# Patient Record
Sex: Female | Born: 1965 | Race: White | Hispanic: Yes | Marital: Married | Smoking: Never smoker
Health system: Southern US, Community
[De-identification: ages and names within clinical notes are randomized; demographics above are authoritative.]

## PROBLEM LIST (undated history)

## (undated) HISTORY — PX: THYROIDECTOMY: SHX17

---

## 2012-08-24 ENCOUNTER — Emergency Department (HOSPITAL_BASED_OUTPATIENT_CLINIC_OR_DEPARTMENT_OTHER): Payer: BC Managed Care – PPO

## 2012-08-24 ENCOUNTER — Encounter (HOSPITAL_BASED_OUTPATIENT_CLINIC_OR_DEPARTMENT_OTHER): Payer: Self-pay | Admitting: *Deleted

## 2012-08-24 ENCOUNTER — Emergency Department (HOSPITAL_BASED_OUTPATIENT_CLINIC_OR_DEPARTMENT_OTHER)
Admission: EM | Admit: 2012-08-24 | Discharge: 2012-08-24 | Disposition: A | Discharge status: Home / Self Care | Payer: BC Managed Care – PPO | Attending: Emergency Medicine | Admitting: Emergency Medicine

## 2012-08-24 DIAGNOSIS — M549 Dorsalgia, unspecified: Secondary | ICD-10-CM | POA: Insufficient documentation

## 2012-08-24 DIAGNOSIS — IMO0001 Reserved for inherently not codable concepts without codable children: Secondary | ICD-10-CM | POA: Insufficient documentation

## 2012-08-24 DIAGNOSIS — J3489 Other specified disorders of nose and nasal sinuses: Secondary | ICD-10-CM | POA: Insufficient documentation

## 2012-08-24 DIAGNOSIS — Z3202 Encounter for pregnancy test, result negative: Secondary | ICD-10-CM | POA: Insufficient documentation

## 2012-08-24 DIAGNOSIS — R110 Nausea: Secondary | ICD-10-CM | POA: Insufficient documentation

## 2012-08-24 DIAGNOSIS — R059 Cough, unspecified: Secondary | ICD-10-CM | POA: Insufficient documentation

## 2012-08-24 DIAGNOSIS — R05 Cough: Secondary | ICD-10-CM | POA: Insufficient documentation

## 2012-08-24 DIAGNOSIS — J111 Influenza due to unidentified influenza virus with other respiratory manifestations: Secondary | ICD-10-CM | POA: Insufficient documentation

## 2012-08-24 DIAGNOSIS — R51 Headache: Secondary | ICD-10-CM | POA: Insufficient documentation

## 2012-08-24 DIAGNOSIS — H538 Other visual disturbances: Secondary | ICD-10-CM | POA: Insufficient documentation

## 2012-08-24 DIAGNOSIS — J029 Acute pharyngitis, unspecified: Secondary | ICD-10-CM | POA: Insufficient documentation

## 2012-08-24 LAB — URINALYSIS, ROUTINE W REFLEX MICROSCOPIC
Glucose, UA: NEGATIVE mg/dL
Leukocytes, UA: NEGATIVE
Protein, ur: NEGATIVE mg/dL
Specific Gravity, Urine: 1.003 — ABNORMAL LOW (ref 1.005–1.030)
Urobilinogen, UA: 0.2 mg/dL (ref 0.0–1.0)

## 2012-08-24 LAB — CBC WITH DIFFERENTIAL/PLATELET
Basophils Absolute: 0 10*3/uL (ref 0.0–0.1)
Basophils Relative: 0 % (ref 0–1)
Lymphocytes Relative: 10 % — ABNORMAL LOW (ref 12–46)
MCHC: 35.4 g/dL (ref 30.0–36.0)
Neutro Abs: 7.7 10*3/uL (ref 1.7–7.7)
Neutrophils Relative %: 83 % — ABNORMAL HIGH (ref 43–77)
RDW: 12.1 % (ref 11.5–15.5)
WBC: 9.3 10*3/uL (ref 4.0–10.5)

## 2012-08-24 LAB — BASIC METABOLIC PANEL
CO2: 29 mEq/L (ref 19–32)
Chloride: 99 mEq/L (ref 96–112)
Creatinine, Ser: 0.5 mg/dL (ref 0.50–1.10)
GFR calc Af Amer: >90 mL/min (ref >90)
Potassium: 3.6 mEq/L (ref 3.5–5.1)
Sodium: 140 mEq/L (ref 135–145)

## 2012-08-24 LAB — URINE MICROSCOPIC-ADD ON

## 2012-08-24 MED ORDER — HYDROMORPHONE HCL PF 1 MG/ML IJ SOLN
1.0000 mg | Freq: Once | INTRAMUSCULAR | Status: AC
  Administered 2012-08-24: 1 mg via INTRAVENOUS
  Filled 2012-08-24: qty 1

## 2012-08-24 MED ORDER — SODIUM CHLORIDE 0.9 % IV BOLUS (SEPSIS): 1000.0000 mL | Freq: Once | INTRAVENOUS | Status: DC

## 2012-08-24 MED ORDER — SODIUM CHLORIDE 0.9 % IV SOLN
INTRAVENOUS | Status: DC
  Administered 2012-08-24: 19:00:00 via INTRAVENOUS

## 2012-08-24 MED ORDER — PROMETHAZINE HCL 25 MG PO TABS: 25.0000 mg | ORAL_TABLET | Freq: Four times a day (QID) | ORAL | Status: DC | PRN

## 2012-08-24 MED ORDER — OSELTAMIVIR PHOSPHATE 75 MG PO CAPS: 75.0000 mg | ORAL_CAPSULE | Freq: Two times a day (BID) | ORAL | Status: DC

## 2012-08-24 MED ORDER — ACETAMINOPHEN 325 MG PO TABS
650.0000 mg | ORAL_TABLET | Freq: Once | ORAL | Status: AC
  Administered 2012-08-24: 650 mg via ORAL
  Filled 2012-08-24: qty 2

## 2012-08-24 MED ORDER — ONDANSETRON HCL 4 MG/2ML IJ SOLN
4.0000 mg | Freq: Once | INTRAMUSCULAR | Status: AC
  Administered 2012-08-24: 4 mg via INTRAVENOUS
  Filled 2012-08-24: qty 2

## 2012-08-24 MED ORDER — NAPROXEN 500 MG PO TABS: 500.0000 mg | ORAL_TABLET | Freq: Two times a day (BID) | ORAL | Status: DC

## 2012-08-24 MED ORDER — MUCINEX DM 30-600 MG PO TB12: 1.0000 | ORAL_TABLET | Freq: Two times a day (BID) | ORAL | Status: DC

## 2012-08-24 NOTE — ED Provider Notes (Signed)
History  This chart was scribed for Deanna Jakes, MD by Shari Heritage, ED Scribe. The patient was seen in room MH01/MH01. Patient's care was started at 1752.  CSN: [id number]  Arrival date & time 08/24/12  1546   First MD Initiated Contact with Patient 08/24/12 1752      Chief Complaint  Patient presents with  . Fever     Patient is a 47 y.o. female presenting with fever. The history is provided by the patient. No language interpreter was used.  Fever Primary symptoms of the febrile illness include fever, headaches, cough, nausea and myalgias. Primary symptoms do not include shortness of breath, abdominal pain, vomiting, diarrhea, dysuria or rash. The current episode started 3 to 5 days ago. This is a new problem. The problem has been gradually worsening.  The fever began 3 to 5 days ago. The fever has been gradually worsening since its onset. The maximum temperature recorded prior to her arrival was more than 104 F. The temperature was taken by a tympanic thermometer.  The headache began more than 2 days ago. The headache developed gradually. Headache is a new problem. The headache is present continuously.    HPI Comments: Deanna Hudson is a 47 y.o. female who presents to the Emergency Department complaining of fever, productive cough and headache onset 4 days ago. Tmax at home was 105 and her temp yesterday was 102. There is associated sore throat, nausea, severe frontal headache, congestion, back pain, body aches and some blurred vision. Patient denies abdominal pain, diarrhea, vomiting, dysuria, rash, visual changes, chest pain, shortness of breath, or bleeding easily. Patient has had a flu shot this season. Patient does not smoke. She reports no significant past medical or surgical history.   History reviewed. No pertinent family history.  History  Substance Use Topics  . Smoking status: Never Smoker   . Smokeless tobacco: Not on file  . Alcohol Use:     OB History    Grav Para  Term Preterm Abortions TAB SAB Ect Mult Living                  Review of Systems  Constitutional: Positive for fever.  HENT: Positive for congestion and sore throat.   Eyes: Positive for visual disturbance.  Respiratory: Positive for cough. Negative for shortness of breath.   Cardiovascular: Negative for chest pain.  Gastrointestinal: Positive for nausea. Negative for vomiting, abdominal pain and diarrhea.  Genitourinary: Negative for dysuria.  Musculoskeletal: Positive for myalgias and back pain.  Skin: Negative for rash.  Neurological: Positive for headaches.  Hematological: Does not bruise/bleed easily.    Allergies  Review of patient's allergies indicates no known allergies.  Home Medications   Current Outpatient Rx  Name  Route  Sig  Dispense  Refill  . MUCINEX DM 30-600 MG PO TB12   Oral   Take 1 tablet by mouth every 12 (twelve) hours.   28 each   0   . NAPROXEN 500 MG PO TABS   Oral   Take 1 tablet (500 mg total) by mouth 2 (two) times daily.   14 tablet   0   . OSELTAMIVIR PHOSPHATE 75 MG PO CAPS   Oral   Take 1 capsule (75 mg total) by mouth every 12 (twelve) hours.   10 capsule   0   . PROMETHAZINE HCL 25 MG PO TABS   Oral   Take 1 tablet (25 mg total) by mouth every 6 (six) hours as  needed for nausea.   12 tablet   0     Triage Vitals: BP 148/85  Pulse 114  Temp 100.7 F (38.2 C) (Oral)  Resp 20  Ht 5\' 2"  (1.575 m)  Wt 124 lb (56.246 kg)  BMI 22.68 kg/m2  SpO2 96%  Physical Exam  Constitutional: She is oriented to person, place, and time. She appears well-developed and well-nourished. No distress.       Feels hot to touch.  HENT:  Head: Normocephalic and atraumatic.  Mouth/Throat: Oropharynx is clear and moist and mucous membranes are normal. Mucous membranes are not dry. No oropharyngeal exudate or posterior oropharyngeal erythema.  Eyes: Conjunctivae normal are normal. Pupils are equal, round, and reactive to light.       Sclera are  clear.  Neck: Neck supple.  Cardiovascular: Regular rhythm.  Tachycardia present.   No murmur heard. Pulmonary/Chest: No respiratory distress. She has no wheezes. She has no rales.  Abdominal: Soft. Bowel sounds are normal. She exhibits no distension. There is no tenderness. There is no rebound and no guarding.  Musculoskeletal: Normal range of motion.       No swelling in ankles.  Lymphadenopathy:    She has no cervical adenopathy.  Neurological: She is alert and oriented to person, place, and time. No cranial nerve deficit.  Skin: Skin is warm and dry. No rash noted.  Psychiatric: She has a normal mood and affect. Her behavior is normal.    ED Course  Procedures (including critical care time) DIAGNOSTIC STUDIES: Oxygen Saturation is 96% on room air, adequate by my interpretation.    COORDINATION OF CARE: 6:36 PM- Patient informed of current plan for treatment and evaluation and agrees with plan at this time.   Results for orders placed during the hospital encounter of 08/24/12  PREGNANCY, URINE      Component Value Range   Preg Test, Ur NEGATIVE  NEGATIVE  URINALYSIS, ROUTINE W REFLEX MICROSCOPIC      Component Value Range   Color, Urine YELLOW  YELLOW   APPearance CLEAR  CLEAR   Specific Gravity, Urine 1.003 (*) 1.005 - 1.030   pH 6.5  5.0 - 8.0   Glucose, UA NEGATIVE  NEGATIVE mg/dL   Hgb urine dipstick SMALL (*) NEGATIVE   Bilirubin Urine NEGATIVE  NEGATIVE   Ketones, ur NEGATIVE  NEGATIVE mg/dL   Protein, ur NEGATIVE  NEGATIVE mg/dL   Urobilinogen, UA 0.2  0.0 - 1.0 mg/dL   Nitrite NEGATIVE  NEGATIVE   Leukocytes, UA NEGATIVE  NEGATIVE  URINE MICROSCOPIC-ADD ON      Component Value Range   RBC / HPF 3-6  <3 RBC/hpf  CBC WITH DIFFERENTIAL      Component Value Range   WBC 9.3  4.0 - 10.5 K/uL   RBC 4.63  3.87 - 5.11 MIL/uL   Hemoglobin 14.6  12.0 - 15.0 g/dL   HCT 40.9  81.1 - 91.4 %   MCV 89.2  78.0 - 100.0 fL   MCH 31.5  26.0 - 34.0 pg   MCHC 35.4  30.0 -  36.0 g/dL   RDW 78.2  95.6 - 21.3 %   Platelets 250  150 - 400 K/uL   Neutrophils Relative 83 (*) 43 - 77 %   Neutro Abs 7.7  1.7 - 7.7 K/uL   Lymphocytes Relative 10 (*) 12 - 46 %   Lymphs Abs 0.9  0.7 - 4.0 K/uL   Monocytes Relative 5  3 - 12 %  Monocytes Absolute 0.4  0.1 - 1.0 K/uL   Eosinophils Relative 2  0 - 5 %   Eosinophils Absolute 0.2  0.0 - 0.7 K/uL   Basophils Relative 0  0 - 1 %   Basophils Absolute 0.0  0.0 - 0.1 K/uL  BASIC METABOLIC PANEL      Component Value Range   Sodium 140  135 - 145 mEq/L   Potassium 3.6  3.5 - 5.1 mEq/L   Chloride 99  96 - 112 mEq/L   CO2 29  19 - 32 mEq/L   Glucose, Bld 107 (*) 70 - 99 mg/dL   BUN 4 (*) 6 - 23 mg/dL   Creatinine, Ser 0.98  0.50 - 1.10 mg/dL   Calcium 9.4  8.4 - 11.9 mg/dL   GFR calc non Af Amer >90  >90 mL/min   GFR calc Af Amer >90  >90 mL/min    Dg Chest 2 View  08/24/2012  *RADIOLOGY REPORT*  Clinical Data: Cough, congestion, fever.  CHEST - 2 VIEW  Comparison: None.  Findings: Normal heart size with clear lung fields.  No bony abnormality.  IMPRESSION: Negative exam.   Original Report Authenticated By: Davonna Belling, M.D.      1. Influenza       MDM   Patient symptoms consistent with the viral influenza. Not toxic no acute distress no pneumonia. However has been tachycardic and febrile. Patient improved with IV fluids in the emergency department. Will off for Tamiflu although symptoms have been ongoing for more than 2 days. Work note provided. No evidence of pneumonia as stated.      I personally performed the services described in this documentation, which was scribed in my presence. The recorded information has been reviewed and is accurate.     Deanna Jakes, MD 08/24/12 2043

## 2012-08-24 NOTE — ED Notes (Signed)
Fever, cough, headache since Wed

## 2020-08-14 ENCOUNTER — Emergency Department (HOSPITAL_BASED_OUTPATIENT_CLINIC_OR_DEPARTMENT_OTHER): Payer: BLUE CROSS/BLUE SHIELD

## 2020-08-14 ENCOUNTER — Emergency Department (HOSPITAL_BASED_OUTPATIENT_CLINIC_OR_DEPARTMENT_OTHER)
Admission: EM | Admit: 2020-08-14 | Discharge: 2020-08-14 | Disposition: A | Discharge status: Home / Self Care | Payer: BLUE CROSS/BLUE SHIELD | Attending: Emergency Medicine | Admitting: Emergency Medicine

## 2020-08-14 ENCOUNTER — Encounter (HOSPITAL_BASED_OUTPATIENT_CLINIC_OR_DEPARTMENT_OTHER): Payer: Self-pay | Admitting: Emergency Medicine

## 2020-08-14 ENCOUNTER — Other Ambulatory Visit: Payer: Self-pay

## 2020-08-14 DIAGNOSIS — N3000 Acute cystitis without hematuria: Secondary | ICD-10-CM | POA: Diagnosis not present

## 2020-08-14 DIAGNOSIS — R1030 Lower abdominal pain, unspecified: Secondary | ICD-10-CM | POA: Diagnosis present

## 2020-08-14 LAB — COMPREHENSIVE METABOLIC PANEL
ALT: 20 U/L (ref 0–44)
AST: 28 U/L (ref 15–41)
Albumin: 4.4 g/dL (ref 3.5–5.0)
Alkaline Phosphatase: 76 U/L (ref 38–126)
Anion gap: 11 (ref 5–15)
BUN: 9 mg/dL (ref 6–20)
CO2: 27 mmol/L (ref 22–32)
Calcium: 8.9 mg/dL (ref 8.9–10.3)
Chloride: 100 mmol/L (ref 98–111)
Creatinine, Ser: 0.45 mg/dL (ref 0.44–1.00)
GFR, Estimated: >60 mL/min (ref >60)
Glucose, Bld: 90 mg/dL (ref 70–99)
Potassium: 3.6 mmol/L (ref 3.5–5.1)
Sodium: 138 mmol/L (ref 135–145)
Total Bilirubin: 0.7 mg/dL (ref 0.3–1.2)
Total Protein: 7.1 g/dL (ref 6.5–8.1)

## 2020-08-14 LAB — CBC WITH DIFFERENTIAL/PLATELET
Abs Immature Granulocytes: 0.02 10*3/uL (ref 0.00–0.07)
Basophils Absolute: 0.1 10*3/uL (ref 0.0–0.1)
Basophils Relative: 1 %
Eosinophils Absolute: 0.4 10*3/uL (ref 0.0–0.5)
Eosinophils Relative: 5 %
HCT: 37.2 % (ref 36.0–46.0)
Hemoglobin: 12.9 g/dL (ref 12.0–15.0)
Immature Granulocytes: 0 %
Lymphocytes Relative: 22 %
Lymphs Abs: 1.7 10*3/uL (ref 0.7–4.0)
MCH: 31.1 pg (ref 26.0–34.0)
MCHC: 34.7 g/dL (ref 30.0–36.0)
MCV: 89.6 fL (ref 80.0–100.0)
Monocytes Absolute: 0.4 10*3/uL (ref 0.1–1.0)
Monocytes Relative: 5 %
Neutro Abs: 5.2 10*3/uL (ref 1.7–7.7)
Neutrophils Relative %: 67 %
Platelets: 272 10*3/uL (ref 150–400)
RBC: 4.15 MIL/uL (ref 3.87–5.11)
RDW: 12.5 % (ref 11.5–15.5)
WBC: 7.7 10*3/uL (ref 4.0–10.5)
nRBC: 0 % (ref 0.0–0.2)

## 2020-08-14 LAB — URINALYSIS, ROUTINE W REFLEX MICROSCOPIC
Bilirubin Urine: NEGATIVE
Glucose, UA: NEGATIVE mg/dL
Ketones, ur: NEGATIVE mg/dL
Nitrite: NEGATIVE
Protein, ur: NEGATIVE mg/dL
Specific Gravity, Urine: 1.005 (ref 1.005–1.030)
pH: 7 (ref 5.0–8.0)

## 2020-08-14 LAB — LIPASE, BLOOD: Lipase: 26 U/L (ref 11–51)

## 2020-08-14 LAB — URINALYSIS, MICROSCOPIC (REFLEX): WBC, UA: >50 WBC/hpf (ref 0–5)

## 2020-08-14 LAB — PREGNANCY, URINE: Preg Test, Ur: NEGATIVE

## 2020-08-14 MED ORDER — PHENAZOPYRIDINE HCL 200 MG PO TABS: 200.0000 mg | ORAL_TABLET | Freq: Three times a day (TID) | ORAL | 0 refills | Status: AC

## 2020-08-14 MED ORDER — MORPHINE SULFATE (PF) 4 MG/ML IV SOLN
4.0000 mg | Freq: Once | INTRAVENOUS | Status: AC
  Administered 2020-08-14: 4 mg via INTRAVENOUS
  Filled 2020-08-14: qty 1

## 2020-08-14 MED ORDER — CEPHALEXIN 500 MG PO CAPS: 500.0000 mg | ORAL_CAPSULE | Freq: Four times a day (QID) | ORAL | 0 refills | Status: AC

## 2020-08-14 MED ORDER — IBUPROFEN 600 MG PO TABS: 600.0000 mg | ORAL_TABLET | Freq: Four times a day (QID) | ORAL | 0 refills | Status: AC | PRN

## 2020-08-14 MED ORDER — SODIUM CHLORIDE 0.9 % IV SOLN
INTRAVENOUS | Status: DC | PRN
  Administered 2020-08-14: 250 mL via INTRAVENOUS

## 2020-08-14 MED ORDER — SODIUM CHLORIDE 0.9 % IV SOLN
1.0000 g | Freq: Once | INTRAVENOUS | Status: AC
  Administered 2020-08-14: 1 g via INTRAVENOUS
  Filled 2020-08-14: qty 10

## 2020-08-14 MED ORDER — SODIUM CHLORIDE 0.9 % IV BOLUS
1000.0000 mL | Freq: Once | INTRAVENOUS | Status: AC
  Administered 2020-08-14: 1000 mL via INTRAVENOUS

## 2020-08-14 MED ORDER — ONDANSETRON HCL 4 MG/2ML IJ SOLN
4.0000 mg | Freq: Once | INTRAMUSCULAR | Status: AC
  Administered 2020-08-14: 4 mg via INTRAVENOUS
  Filled 2020-08-14: qty 2

## 2020-08-14 MED ORDER — SODIUM CHLORIDE 0.9 % IV SOLN: INTRAVENOUS | Status: DC

## 2020-08-14 NOTE — ED Provider Notes (Signed)
[facility] EMERGENCY DEPARTMENT Provider Note   CSN: [id number] Arrival date & time: 08/14/20  3762     History Chief Complaint  Patient presents with  . Abdominal Pain    Deanna Hudson is a 54 y.o. female.  HPI   Patient presents to the ED for evaluation of abdominal pain.  Patient states she started having the symptoms in the last day or 2.  Patient has been having pain in her lower abdomen.  It goes to her back.  Sometimes it is more in the right side but it seems to go on both sides.  She is also noticed increased urinary frequency and discomfort with urination.  Pain was more severe last night and she was not able to sleep well.  She is not having any trouble with fevers.  No vomiting.  No diarrhea.  Patient denies any prior surgeries.  History reviewed. No pertinent past medical history.  There are no problems to display for this patient.   Past Surgical History:  Procedure Laterality Date  . THYROIDECTOMY       OB History   No obstetric history on file.     History reviewed. No pertinent family history.  Social History   Tobacco Use  . Smoking status: Never Smoker  . Smokeless tobacco: Never Used  Substance Use Topics  . Drug use: No    Home Medications Prior to Admission medications   Medication Sig Start Date End Date Taking? Authorizing Provider  acetaminophen (TYLENOL) 325 MG tablet Take 650 mg by mouth every 6 (six) hours as needed.    [provider]  cephALEXin (KEFLEX) 500 MG capsule Take 1 capsule (500 mg total) by mouth 4 (four) times daily. 08/14/20   Linwood Dibbles, MD  ibuprofen (ADVIL) 600 MG tablet Take 1 tablet (600 mg total) by mouth every 6 (six) hours as needed. 08/14/20   Linwood Dibbles, MD  phenazopyridine (PYRIDIUM) 200 MG tablet Take 1 tablet (200 mg total) by mouth 3 (three) times daily. 08/14/20   Linwood Dibbles, MD  promethazine (PHENERGAN) 25 MG tablet Take 1 tablet (25 mg total) by mouth every 6 (six) hours as needed for  nausea. 08/24/12 08/14/20  Vanetta Mulders, MD    Allergies    Patient has no known allergies.  Review of Systems   Review of Systems  All other systems reviewed and are negative.   Physical Exam Updated Vital Signs BP (!) 156/81 (BP Location: Right Arm)   Pulse 67   Temp 97.7 F (36.5 C) (Oral)   Resp 14   Ht 1.575 m (5\' 2" )   Wt 50.8 kg   SpO2 100%   BMI 20.49 kg/m   Physical Exam Vitals and nursing note reviewed.  Constitutional:      General: She is not in acute distress.    Appearance: She is well-developed and well-nourished.  HENT:     Head: Normocephalic and atraumatic.     Right Ear: External ear normal.     Left Ear: External ear normal.  Eyes:     General: No scleral icterus.       Right eye: No discharge.        Left eye: No discharge.     Conjunctiva/sclera: Conjunctivae normal.  Neck:     Trachea: No tracheal deviation.  Cardiovascular:     Rate and Rhythm: Normal rate and regular rhythm.     Pulses: Intact distal pulses.  Pulmonary:     Effort: Pulmonary effort  is normal. No respiratory distress.     Breath sounds: Normal breath sounds. No stridor. No wheezing or rales.  Abdominal:     General: Bowel sounds are normal. There is no distension.     Palpations: Abdomen is soft.     Tenderness: There is abdominal tenderness in the right lower quadrant, suprapubic area and left lower quadrant. There is guarding. There is no rebound.  Musculoskeletal:        General: No tenderness or edema.     Cervical back: Neck supple.  Skin:    General: Skin is warm and dry.     Findings: No rash.  Neurological:     Mental Status: She is alert.     Cranial Nerves: No cranial nerve deficit (no facial droop, extraocular movements intact, no slurred speech).     Sensory: No sensory deficit.     Motor: No abnormal muscle tone or seizure activity.     Coordination: Coordination normal.     Deep Tendon Reflexes: Strength normal.  Psychiatric:        Mood and  Affect: Mood and affect normal.     ED Results / Procedures / Treatments   Labs (all labs ordered are listed, but only abnormal results are displayed) Labs Reviewed  URINALYSIS, ROUTINE W REFLEX MICROSCOPIC - Abnormal; Notable for the following components:      Result Value   APPearance HAZY (*)    Hgb urine dipstick LARGE (*)    Leukocytes,Ua MODERATE (*)    All other components within normal limits  URINALYSIS, MICROSCOPIC (REFLEX) - Abnormal; Notable for the following components:   Bacteria, UA FEW (*)    All other components within normal limits  COMPREHENSIVE METABOLIC PANEL  LIPASE, BLOOD  CBC WITH DIFFERENTIAL/PLATELET  PREGNANCY, URINE    EKG None  Radiology CT Renal Stone Study  Result Date: 08/14/2020 CLINICAL DATA:  Flank pain and difficulty urinating EXAM: CT ABDOMEN AND PELVIS WITHOUT CONTRAST TECHNIQUE: Multidetector CT imaging of the abdomen and pelvis was performed following the standard protocol without IV contrast. COMPARISON:  01/14/2018 FINDINGS: Lower chest: No acute abnormality. Hepatobiliary: Small hepatic cyst is noted stable from the prior exam. Gallbladder is within normal limits. Pancreas: Unremarkable. No pancreatic ductal dilatation or surrounding inflammatory changes. Spleen: Normal in size without focal abnormality. Adrenals/Urinary Tract: Adrenal glands demonstrate a hypodense lesion within the left adrenal gland stable from the prior study likely representing an adenoma. Kidneys show no renal calculi. Bladder is decompressed. No obstructive changes are seen. Stomach/Bowel: The appendix is within normal limits. Colon shows no obstructive or inflammatory changes. Small bowel and stomach are within normal limits. Vascular/Lymphatic: No significant vascular findings are present. No enlarged abdominal or pelvic lymph nodes. Reproductive: Uterus and bilateral adnexa are unremarkable. Other: No abdominal wall hernia or abnormality. No abdominopelvic ascites.  Musculoskeletal: Mild degenerative changes of lumbar spine are noted. IMPRESSION: No renal calculi or urinary tract obstructive changes are noted. Stable hepatic cyst and left adrenal lesion when compared with the prior exam. Electronically Signed   By: Alcide Clever M.D.   On: 08/14/2020 08:52    Procedures Procedures (including critical care time)  Medications Ordered in ED Medications  sodium chloride 0.9 % bolus 1,000 mL (0 mLs Intravenous Stopped 08/14/20 0913)    And  0.9 %  sodium chloride infusion ( Intravenous New Bag/Given 08/14/20 0914)  0.9 %  sodium chloride infusion (250 mLs Intravenous New Bag/Given 08/14/20 0901)  ondansetron (ZOFRAN) injection 4 mg (4  mg Intravenous Given 08/14/20 0800)  morphine 4 MG/ML injection 4 mg (4 mg Intravenous Given 08/14/20 0800)  cefTRIAXone (ROCEPHIN) 1 g in sodium chloride 0.9 % 100 mL IVPB (1 g Intravenous New Bag/Given 08/14/20 2505)    ED Course  I have reviewed the triage vital signs and the nursing notes.  Pertinent labs & imaging results that were available during my care of the patient were reviewed by me and considered in my medical decision making (see chart for details).  Clinical Course as of 08/14/20 3976  Wynelle Link Aug 14, 2020  0826 Patient's laboratory tests are notable for few bacteria greater than 50 white blood cells and 6-10 RBCs.  Most likely urinary tract infection but the patient does have large amount of hemoglobin.  Will CT to rule out renal stone. [JK]    Clinical Course User Index [JK] Linwood Dibbles, MD   MDM Rules/Calculators/A&P                          Patient presented with abdominal pain urinary symptoms.  Laboratory tests are reassuring.  CBC and metabolic panel are normal.  Urinalysis does suggest urinary tract infection.  She did have hematuria so I was concerned of the possibility of ureteral colic associated with kidney stones.  CT scan does not show any acute abnormalities.  No signs of diverticulitis or  ureterolithiasis.  Doubt pyelonephritis at this time but patient symptoms are more than just a simple UTI.  Will discharge home with a course of antibiotics.   Final Clinical Impression(s) / ED Diagnoses Final diagnoses:  Acute cystitis without hematuria    Rx / DC Orders ED Discharge Orders         Ordered    cephALEXin (KEFLEX) 500 MG capsule  4 times daily        08/14/20 0931    phenazopyridine (PYRIDIUM) 200 MG tablet  3 times daily        08/14/20 0931    ibuprofen (ADVIL) 600 MG tablet  Every 6 hours PRN        08/14/20 0931           Linwood Dibbles, MD 08/14/20 [phone]

## 2020-08-14 NOTE — ED Notes (Signed)
ED Provider at bedside. 

## 2020-08-14 NOTE — ED Notes (Signed)
Pt to CT

## 2020-08-14 NOTE — Discharge Instructions (Signed)
Take the antibiotics as prescribed until they are finished.  Pyridium and ibuprofen are to help with  pain and discomfort.  Follow-up with your doctor to make sure the infection has resolved

## 2020-08-14 NOTE — ED Notes (Signed)
Pt ambulatory with steady gait to restroom to provide urine specimen 

## 2020-08-14 NOTE — ED Triage Notes (Signed)
Abdominal pain that goes into flank pain with urination started yesterday with pain increasing over time.  Has taken total of 5 tylenol without relief. No fever, vomiting, or diarrhea.

## 2020-08-14 NOTE — ED Notes (Signed)
Pt ambulatory with steady gait to restroom with standby assist from husband

## 2020-08-14 NOTE — ED Notes (Signed)
Pt ambulatory with steady gait to restroom 

## 2020-11-21 ENCOUNTER — Encounter (HOSPITAL_BASED_OUTPATIENT_CLINIC_OR_DEPARTMENT_OTHER): Payer: Self-pay

## 2020-11-21 ENCOUNTER — Emergency Department (HOSPITAL_BASED_OUTPATIENT_CLINIC_OR_DEPARTMENT_OTHER)
Admission: EM | Admit: 2020-11-21 | Discharge: 2020-11-21 | Disposition: A | Discharge status: Home / Self Care | Payer: BLUE CROSS/BLUE SHIELD | Attending: Emergency Medicine | Admitting: Emergency Medicine

## 2020-11-21 ENCOUNTER — Other Ambulatory Visit: Payer: Self-pay

## 2020-11-21 ENCOUNTER — Emergency Department (HOSPITAL_BASED_OUTPATIENT_CLINIC_OR_DEPARTMENT_OTHER): Payer: BLUE CROSS/BLUE SHIELD

## 2020-11-21 DIAGNOSIS — W268XXA Contact with other sharp object(s), not elsewhere classified, initial encounter: Secondary | ICD-10-CM | POA: Diagnosis not present

## 2020-11-21 DIAGNOSIS — Y9389 Activity, other specified: Secondary | ICD-10-CM | POA: Diagnosis not present

## 2020-11-21 DIAGNOSIS — S91311A Laceration without foreign body, right foot, initial encounter: Secondary | ICD-10-CM | POA: Diagnosis not present

## 2020-11-21 DIAGNOSIS — Y92007 Garden or yard of unspecified non-institutional (private) residence as the place of occurrence of the external cause: Secondary | ICD-10-CM | POA: Insufficient documentation

## 2020-11-21 DIAGNOSIS — Z23 Encounter for immunization: Secondary | ICD-10-CM | POA: Diagnosis not present

## 2020-11-21 DIAGNOSIS — T1490XA Injury, unspecified, initial encounter: Secondary | ICD-10-CM

## 2020-11-21 DIAGNOSIS — S99921A Unspecified injury of right foot, initial encounter: Secondary | ICD-10-CM | POA: Diagnosis present

## 2020-11-21 MED ORDER — ACETAMINOPHEN 325 MG PO TABS: 650.0000 mg | ORAL_TABLET | Freq: Four times a day (QID) | ORAL | 0 refills | Status: AC | PRN

## 2020-11-21 MED ORDER — TETANUS-DIPHTH-ACELL PERTUSSIS 5-2.5-18.5 LF-MCG/0.5 IM SUSY
0.5000 mL | PREFILLED_SYRINGE | Freq: Once | INTRAMUSCULAR | Status: AC
  Administered 2020-11-21: 0.5 mL via INTRAMUSCULAR
  Filled 2020-11-21: qty 0.5

## 2020-11-21 MED ORDER — IBUPROFEN 800 MG PO TABS
800.0000 mg | ORAL_TABLET | Freq: Once | ORAL | Status: AC
  Administered 2020-11-21: 800 mg via ORAL
  Filled 2020-11-21: qty 1

## 2020-11-21 MED ORDER — IBUPROFEN 600 MG PO TABS: 600.0000 mg | ORAL_TABLET | Freq: Four times a day (QID) | ORAL | 0 refills | Status: AC | PRN

## 2020-11-21 MED ORDER — ACETAMINOPHEN 325 MG PO TABS
650.0000 mg | ORAL_TABLET | Freq: Once | ORAL | Status: AC
  Administered 2020-11-21: 650 mg via ORAL
  Filled 2020-11-21: qty 2

## 2020-11-21 NOTE — ED Notes (Signed)
ED Provider at bedside. 

## 2020-11-21 NOTE — ED Provider Notes (Signed)
[facility] EMERGENCY DEPARTMENT Provider Note   CSN: [id number] Arrival date & time: 11/21/20  1106     History Chief Complaint  Patient presents with  . Laceration    Deanna Hudson is a 55 y.o. female here with small punctate laceration through base of right foot, stepped on a rake outside today.  No fevers, chills.  NKDA.  No other injures.  Tetanus status unknown.  HPI     History reviewed. No pertinent past medical history.  There are no problems to display for this patient.   Past Surgical History:  Procedure Laterality Date  . THYROIDECTOMY       OB History   No obstetric history on file.     History reviewed. No pertinent family history.  Social History   Tobacco Use  . Smoking status: Never Smoker  . Smokeless tobacco: Never Used  Substance Use Topics  . Drug use: No    Home Medications Prior to Admission medications   Medication Sig Start Date End Date Taking? Authorizing Provider  acetaminophen (TYLENOL) 325 MG tablet Take 2 tablets (650 mg total) by mouth every 6 (six) hours as needed for up to 30 doses for mild pain or moderate pain. 11/21/20  Yes Jaaziel Peatross, Kermit Balo, MD  ibuprofen (ADVIL) 600 MG tablet Take 1 tablet (600 mg total) by mouth every 6 (six) hours as needed for up to 30 doses for mild pain or moderate pain. 11/21/20  Yes Glanda Spanbauer, Kermit Balo, MD  acetaminophen (TYLENOL) 325 MG tablet Take 650 mg by mouth every 6 (six) hours as needed.    [provider]  cephALEXin (KEFLEX) 500 MG capsule Take 1 capsule (500 mg total) by mouth 4 (four) times daily. 08/14/20   Linwood Dibbles, MD  ibuprofen (ADVIL) 600 MG tablet Take 1 tablet (600 mg total) by mouth every 6 (six) hours as needed. 08/14/20   Linwood Dibbles, MD  phenazopyridine (PYRIDIUM) 200 MG tablet Take 1 tablet (200 mg total) by mouth 3 (three) times daily. 08/14/20   Linwood Dibbles, MD  promethazine (PHENERGAN) 25 MG tablet Take 1 tablet (25 mg total) by mouth every 6 (six) hours as needed  for nausea. 08/24/12 08/14/20  Vanetta Mulders, MD    Allergies    Patient has no known allergies.  Review of Systems   Review of Systems  Constitutional: Negative for chills and fever.  Musculoskeletal: Positive for gait problem and myalgias.  Skin: Positive for wound. Negative for rash.  Neurological: Negative for weakness and numbness.    Physical Exam Updated Vital Signs BP 128/74 (BP Location: Right Arm)   Pulse 68   Temp 98.3 F (36.8 C) (Oral)   Resp 18   SpO2 99%   Physical Exam Constitutional:      General: She is not in acute distress. HENT:     Head: Normocephalic and atraumatic.  Eyes:     Conjunctiva/sclera: Conjunctivae normal.     Pupils: Pupils are equal, round, and reactive to light.  Cardiovascular:     Rate and Rhythm: Normal rate and regular rhythm.  Pulmonary:     Effort: Pulmonary effort is normal. No respiratory distress.  Skin:    General: Skin is warm and dry.     Comments: 1 cm linear laceration to plantar surface of mid-right foot, minimal bleeding, no surrounding erythema, no palpable foreign body  Neurological:     General: No focal deficit present.     Mental Status: She is alert. Mental status  is at baseline.  Psychiatric:        Mood and Affect: Mood normal.        Behavior: Behavior normal.     ED Results / Procedures / Treatments   Labs (all labs ordered are listed, but only abnormal results are displayed) Labs Reviewed - No data to display  EKG None  Radiology DG Foot Complete Right  Result Date: 11/21/2020 CLINICAL DATA:  Working and guarding. Stepped on rock. Laceration to bottom of right lateral foot. EXAM: RIGHT FOOT COMPLETE - 3+ VIEW COMPARISON:  None. FINDINGS: There is no evidence of fracture or dislocation. There is no evidence of arthropathy or other focal bone abnormality. Soft tissues are unremarkable. IMPRESSION: No acute fracture, dislocation, or radiopaque foreign body. Electronically Signed   By: Acquanetta Belling  M.D.   On: 11/21/2020 12:17    Procedures Procedures   Medications Ordered in ED Medications  Tdap (BOOSTRIX) injection 0.5 mL (0.5 mLs Intramuscular Given 11/21/20 1223)  ibuprofen (ADVIL) tablet 800 mg (800 mg Oral Given 11/21/20 1326)  acetaminophen (TYLENOL) tablet 650 mg (650 mg Oral Given 11/21/20 1326)    ED Course  I have reviewed the triage vital signs and the nursing notes.  Pertinent labs & imaging results that were available during my care of the patient were reviewed by me and considered in my medical decision making (see chart for details).  Wound irrigated and cleaned.  Xrays reviewed, no foreign body noted, nor on my exam.  I applied dermabond adhesive, wrapped foot.  Motrin and tylenol given for pain after irrigation of foot.  No signs of infection at this time.  Crutches given to use as needed for her pain.  Tdap given. Okay for discharge.     Final Clinical Impression(s) / ED Diagnoses Final diagnoses:  Laceration of right foot, initial encounter    Rx / DC Orders ED Discharge Orders         Ordered    Crutches        11/21/20 1320    ibuprofen (ADVIL) 600 MG tablet  Every 6 hours PRN        11/21/20 1324    acetaminophen (TYLENOL) 325 MG tablet  Every 6 hours PRN        11/21/20 1324           Terald Sleeper, MD 11/21/20 1719

## 2020-11-21 NOTE — Discharge Instructions (Addendum)
Keep on the bandages for 2 days.  Then you can take them off.  You can shower or bathe normally with soap and water.  The wound should heal in 7 days.  We gave you crutches to use for comfort at home.  You can begin to walk on it when the pain is better.    You can take motrin and tylenol at home for pain.  Put ice on your foot for 10 minutes at a time.  Schedule an appointment with your doctor in 3 days to look at your foot again.

## 2020-11-21 NOTE — ED Triage Notes (Signed)
Pt states stepped on rake doing yard work, 1cm laceration right foot.  Unsure last tetanus shot

## 2021-07-17 IMAGING — CT CT RENAL STONE PROTOCOL
2 of 4 series · 17 of 46 positions shown, 19 images · non-contrast
Comparison: 01/14/2018

CLINICAL DATA: Flank pain and difficulty urinating

EXAM:
CT ABDOMEN AND PELVIS WITHOUT CONTRAST
TECHNIQUE: Multidetector CT imaging of the abdomen and pelvis was performed
following the standard protocol without IV contrast.

[Series 2: axial st · axial · 0.70mm/px · z∈[+756,+1126]mm · 14 of 82 slices shown, 16 images]
[im 4/82  soft-tissue]
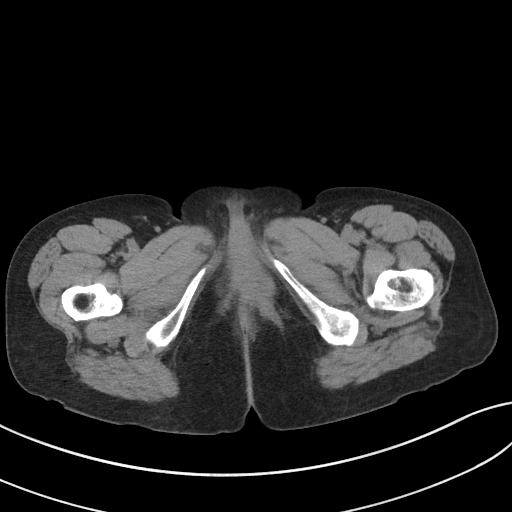
[im 4/82  bone]
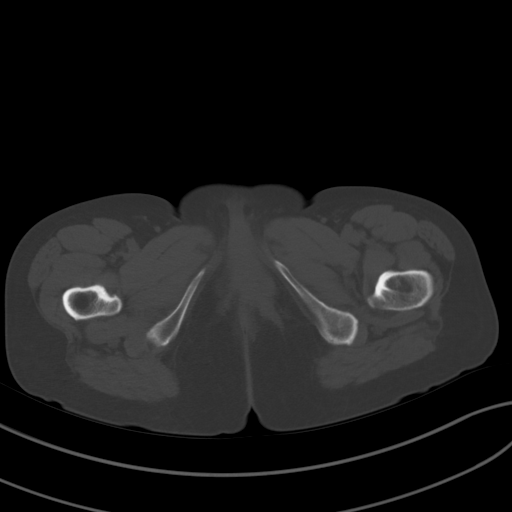
[im 11/82  soft-tissue]
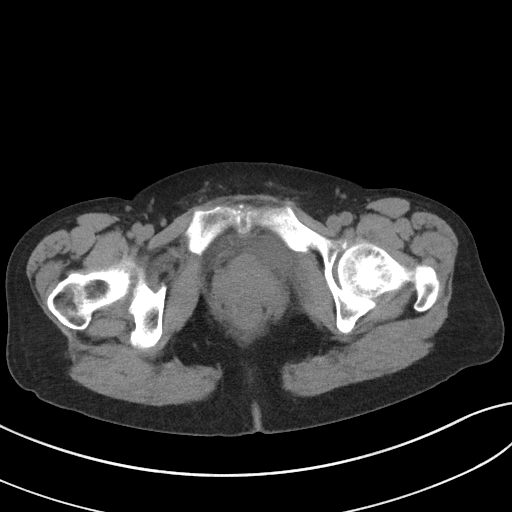
[im 17/82  soft-tissue]
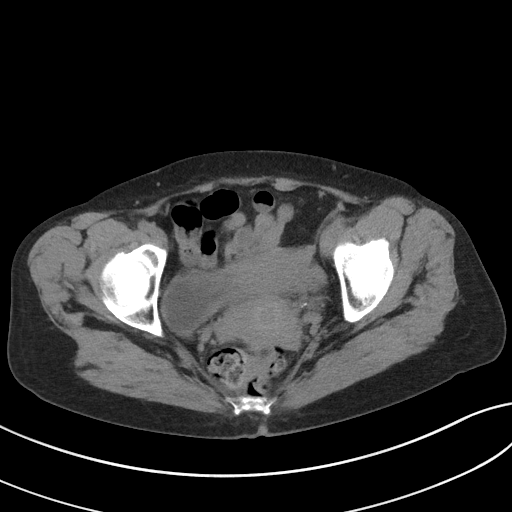
[im 21/82  soft-tissue]
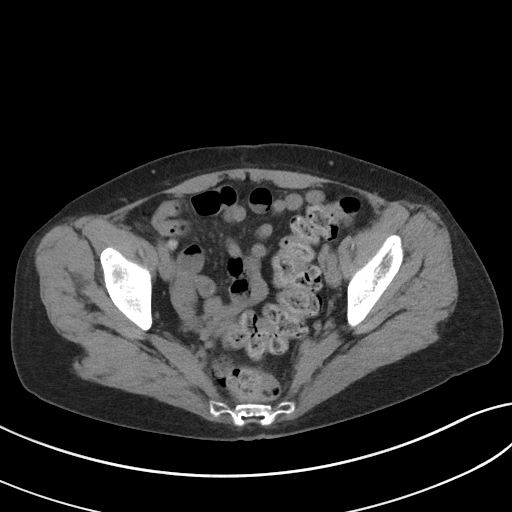
[im 28/82  soft-tissue]
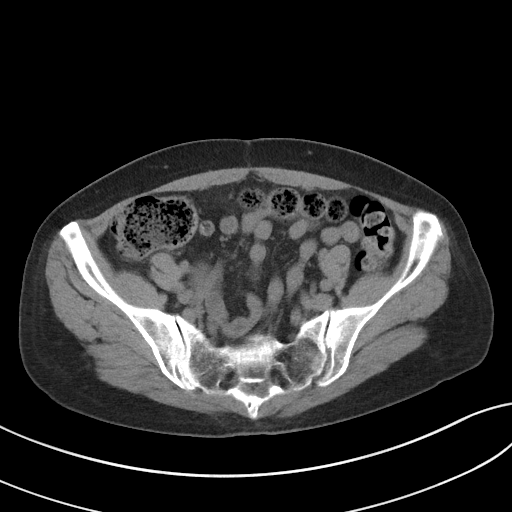
[im 34/82  soft-tissue]
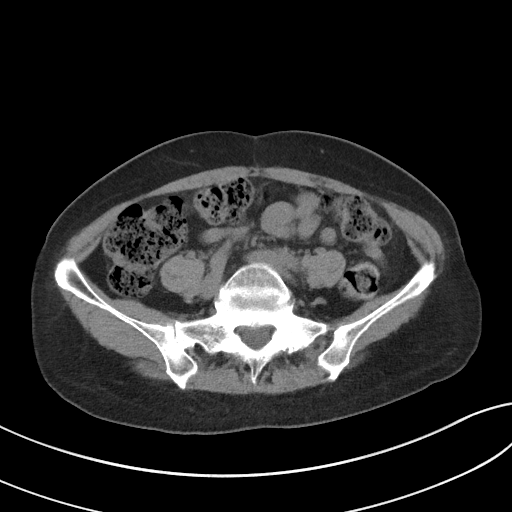
[im 38/82  soft-tissue]
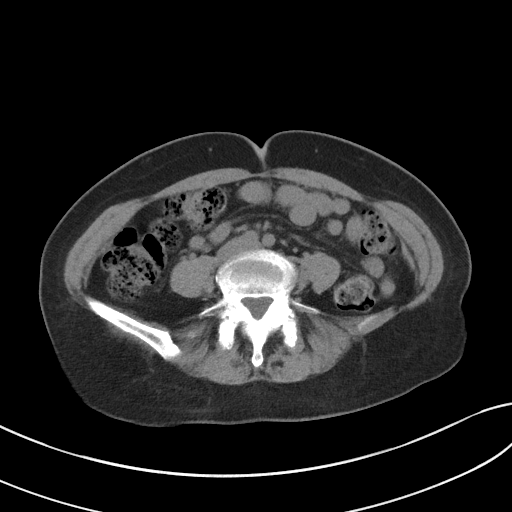
[im 44/82  soft-tissue]
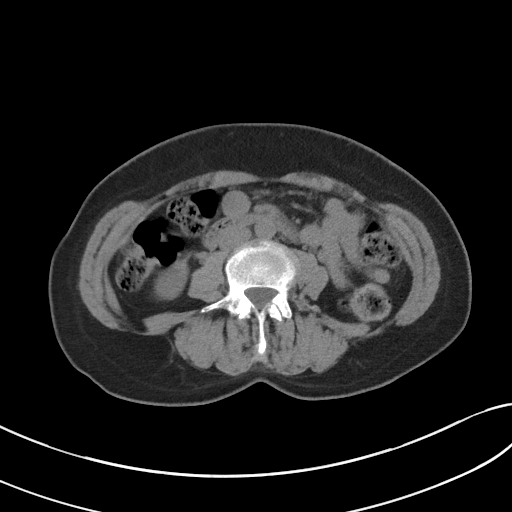
[im 48/82  soft-tissue]
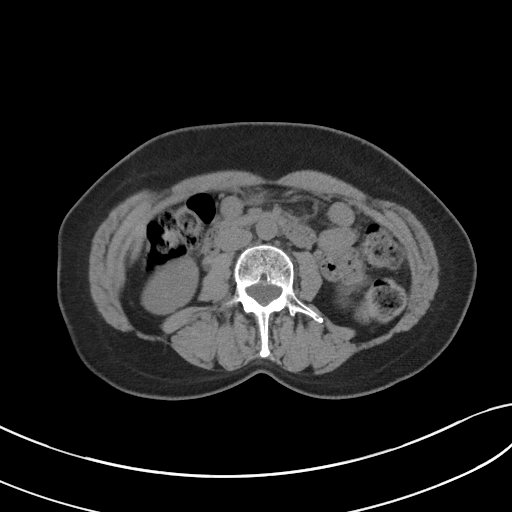
[im 48/82  bone]
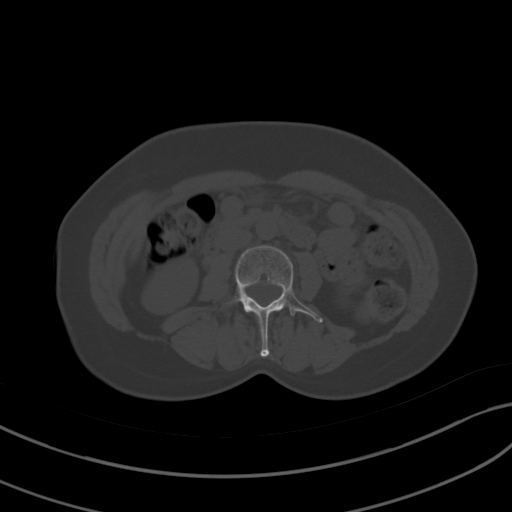
[im 55/82  soft-tissue]
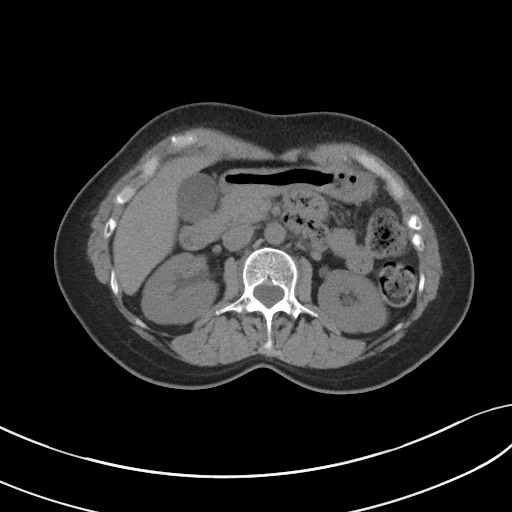
[im 61/82  soft-tissue]
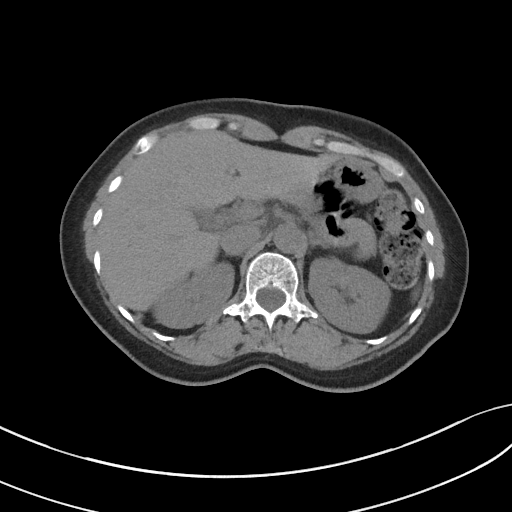
[im 65/82  soft-tissue]
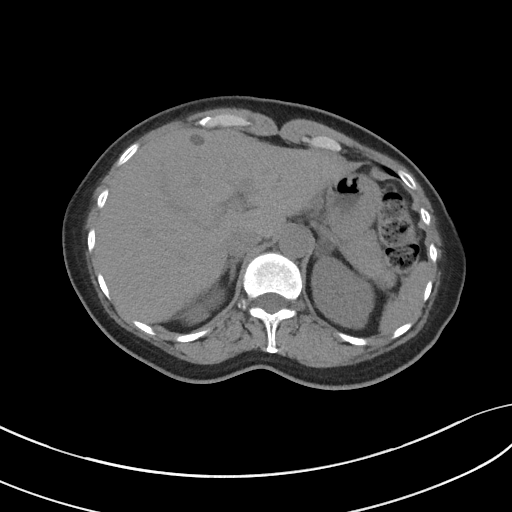
[im 71/82  soft-tissue]
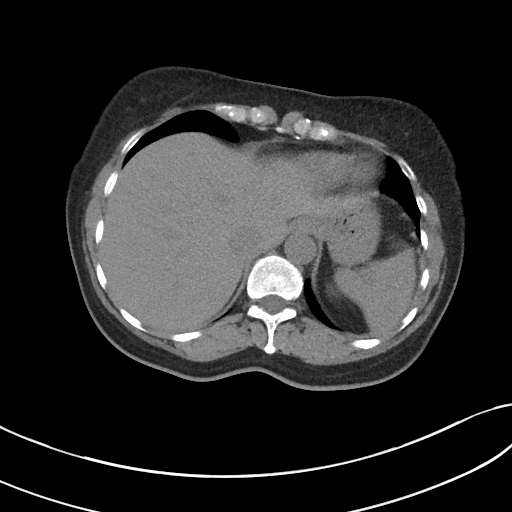
[im 78/82  soft-tissue]
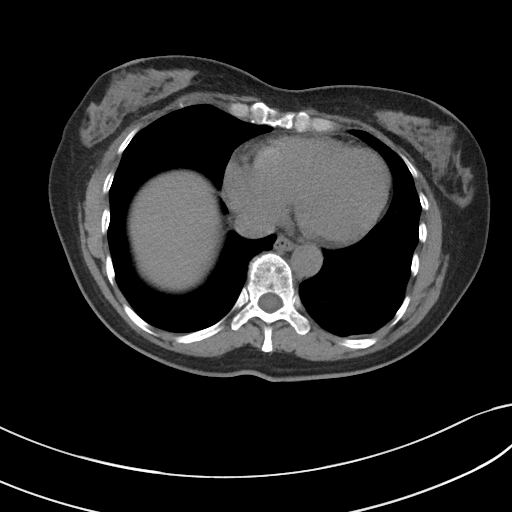

[Series 5: coronal st · coronal · 0.77mm/px · 3 of 97 slices shown]
[im 33/97  soft-tissue]
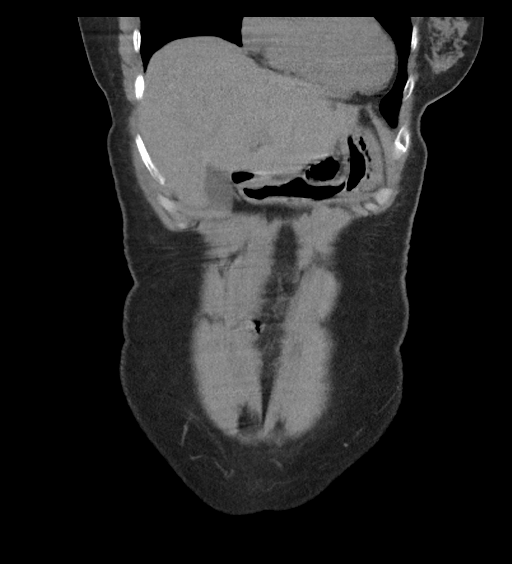
[im 43/97  soft-tissue]
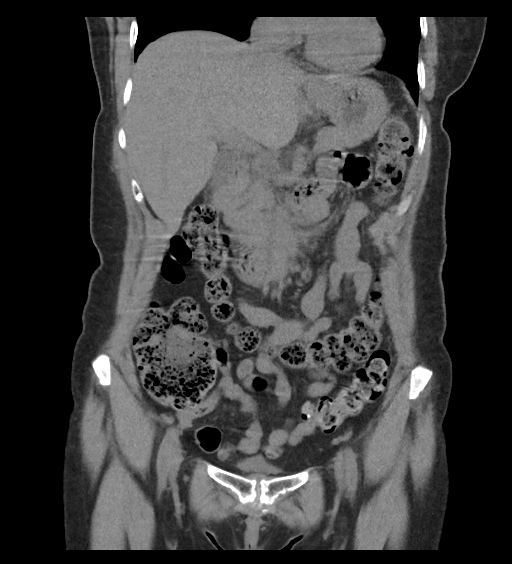
[im 54/97  soft-tissue]
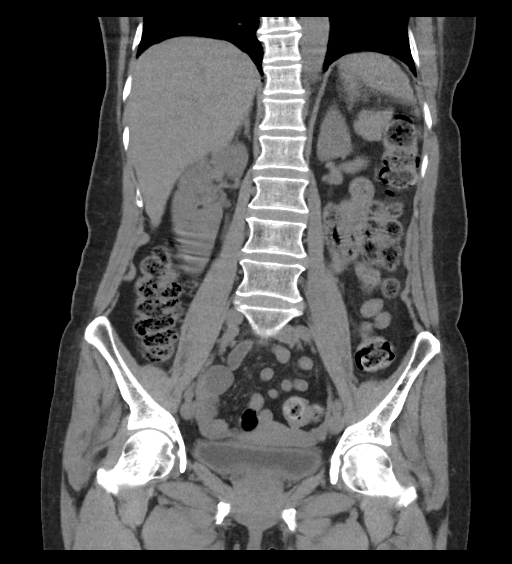

[17 of 46 positions shown; findings below may reference images not displayed]

FINDINGS: Lower chest: No acute abnormality.

Hepatobiliary: Small hepatic cyst is noted stable from the prior
exam. Gallbladder is within normal limits.

Pancreas: Unremarkable. No pancreatic ductal dilatation or
surrounding inflammatory changes.

Spleen: Normal in size without focal abnormality.

Adrenals/Urinary Tract: Adrenal glands demonstrate a hypodense
lesion within the left adrenal gland stable from the prior study
likely representing an adenoma. Kidneys show no renal calculi.
Bladder is decompressed. No obstructive changes are seen.

Stomach/Bowel: The appendix is within normal limits. Colon shows no
obstructive or inflammatory changes. Small bowel and stomach are
within normal limits.

Vascular/Lymphatic: No significant vascular findings are present. No
enlarged abdominal or pelvic lymph nodes.

Reproductive: Uterus and bilateral adnexa are unremarkable.

Other: No abdominal wall hernia or abnormality. No abdominopelvic
ascites.

Musculoskeletal: Mild degenerative changes of lumbar spine are
noted.
IMPRESSION: No renal calculi or urinary tract obstructive changes are noted.

Stable hepatic cyst and left adrenal lesion when compared with the
prior exam.
